# Patient Record
Sex: Male | Born: 1935 | Race: White | Hispanic: No | Marital: Single | Smoking: Never smoker
Health system: Southern US, Community
[De-identification: ages and names within clinical notes are randomized; demographics above are authoritative.]

## PROBLEM LIST (undated history)

## (undated) HISTORY — PX: NASAL SINUS SURGERY: SHX719

## (undated) HISTORY — PX: SKIN LESION EXCISION: SHX2412

---

## 2000-07-12 ENCOUNTER — Encounter: Payer: Self-pay | Admitting: Urology

## 2000-07-12 ENCOUNTER — Inpatient Hospital Stay (HOSPITAL_COMMUNITY): Admission: EM | Admit: 2000-07-12 | Discharge: 2000-07-13 | Payer: Self-pay | Admitting: Emergency Medicine

## 2009-08-09 ENCOUNTER — Inpatient Hospital Stay (HOSPITAL_COMMUNITY): Admission: EM | Admit: 2009-08-09 | Discharge: 2009-08-10 | Payer: Self-pay | Admitting: Emergency Medicine

## 2009-08-09 ENCOUNTER — Ambulatory Visit: Payer: Self-pay | Admitting: Internal Medicine

## 2009-08-19 ENCOUNTER — Ambulatory Visit: Payer: Self-pay | Admitting: Family Medicine

## 2009-08-25 DIAGNOSIS — I10 Essential (primary) hypertension: Secondary | ICD-10-CM | POA: Insufficient documentation

## 2010-07-12 IMAGING — CR DG CHEST 2V
2 series · 2 of 2 positions shown · non-contrast
Comparison: None

CLINICAL DATA: Numbness

CHEST - 2 VIEW

[w chest pa]
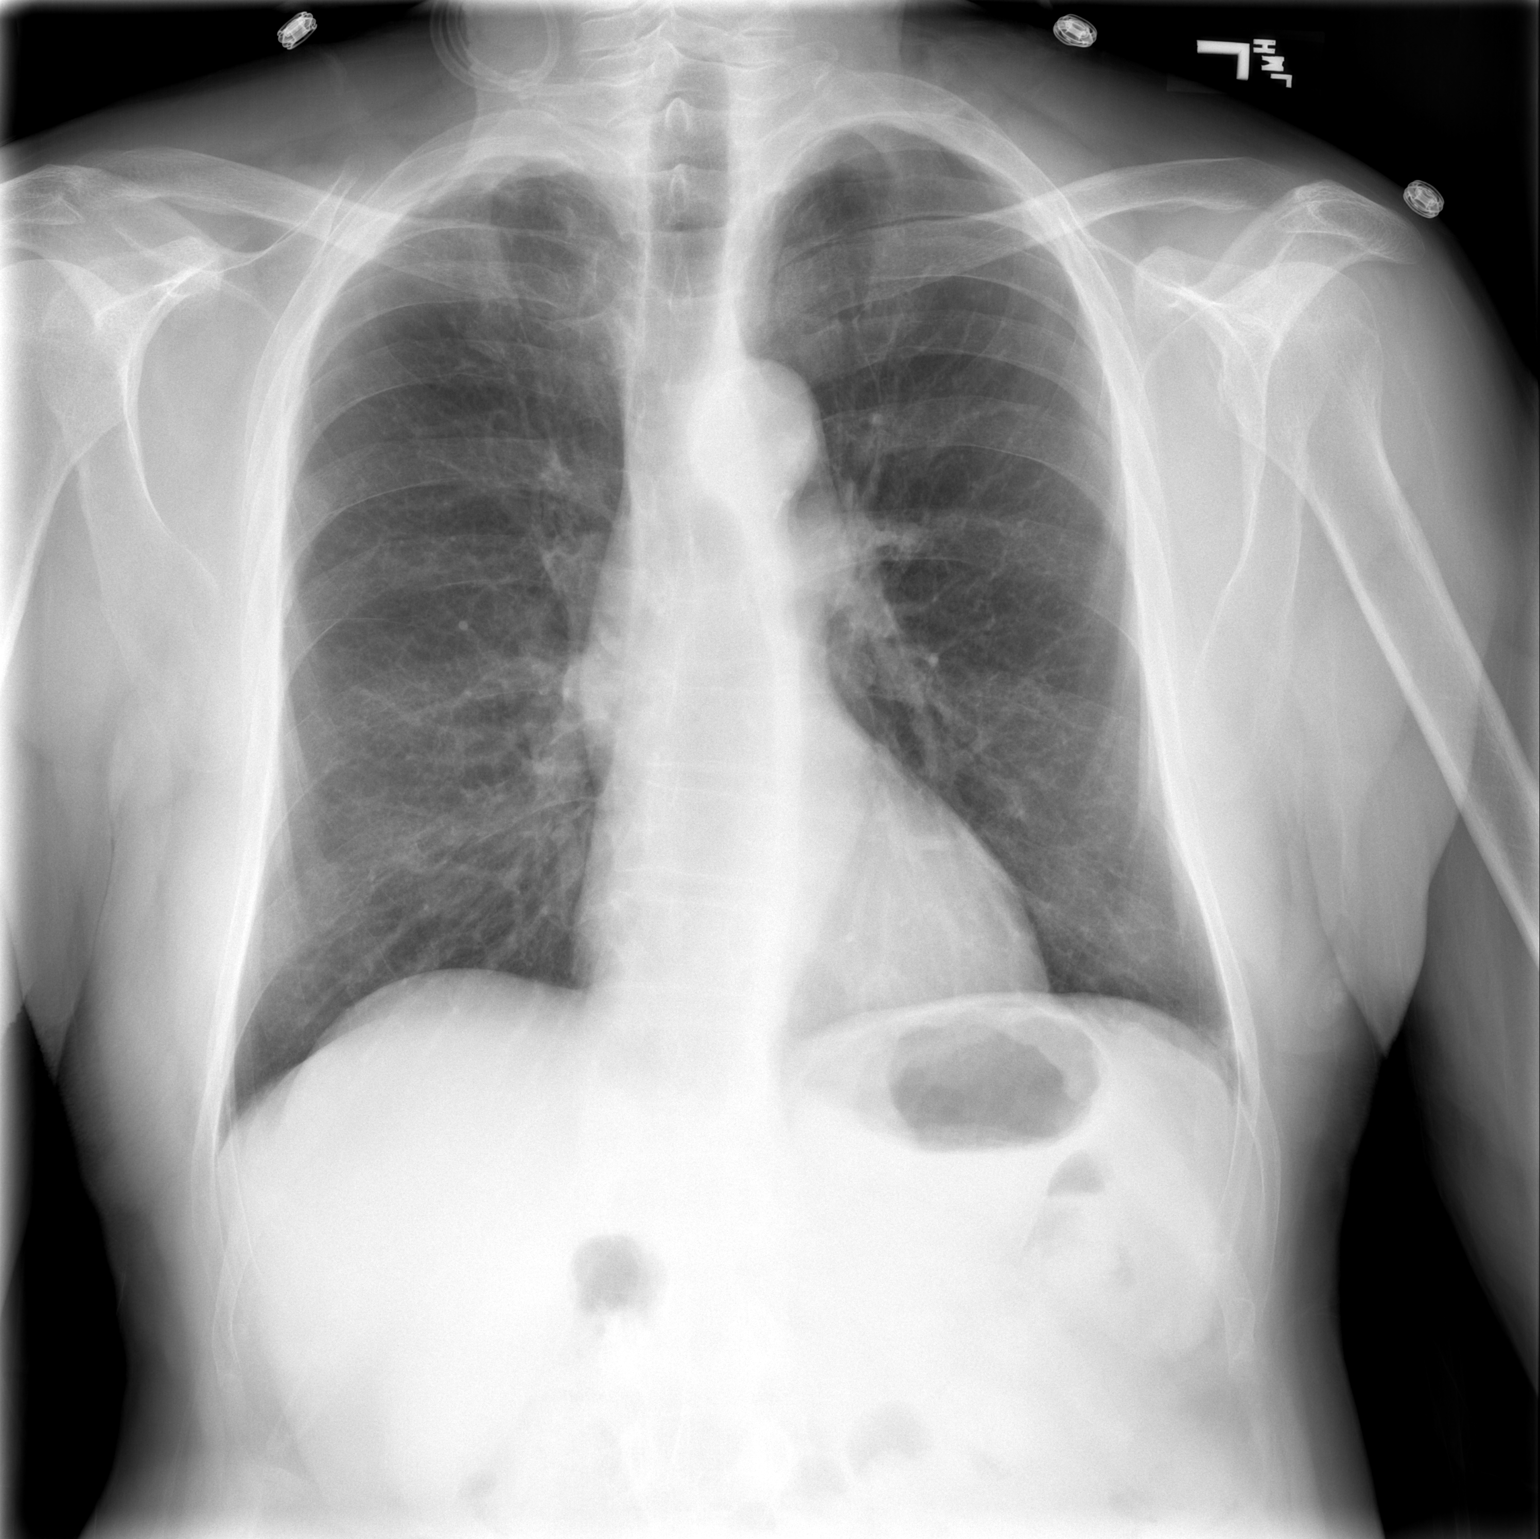

[w chest lat]
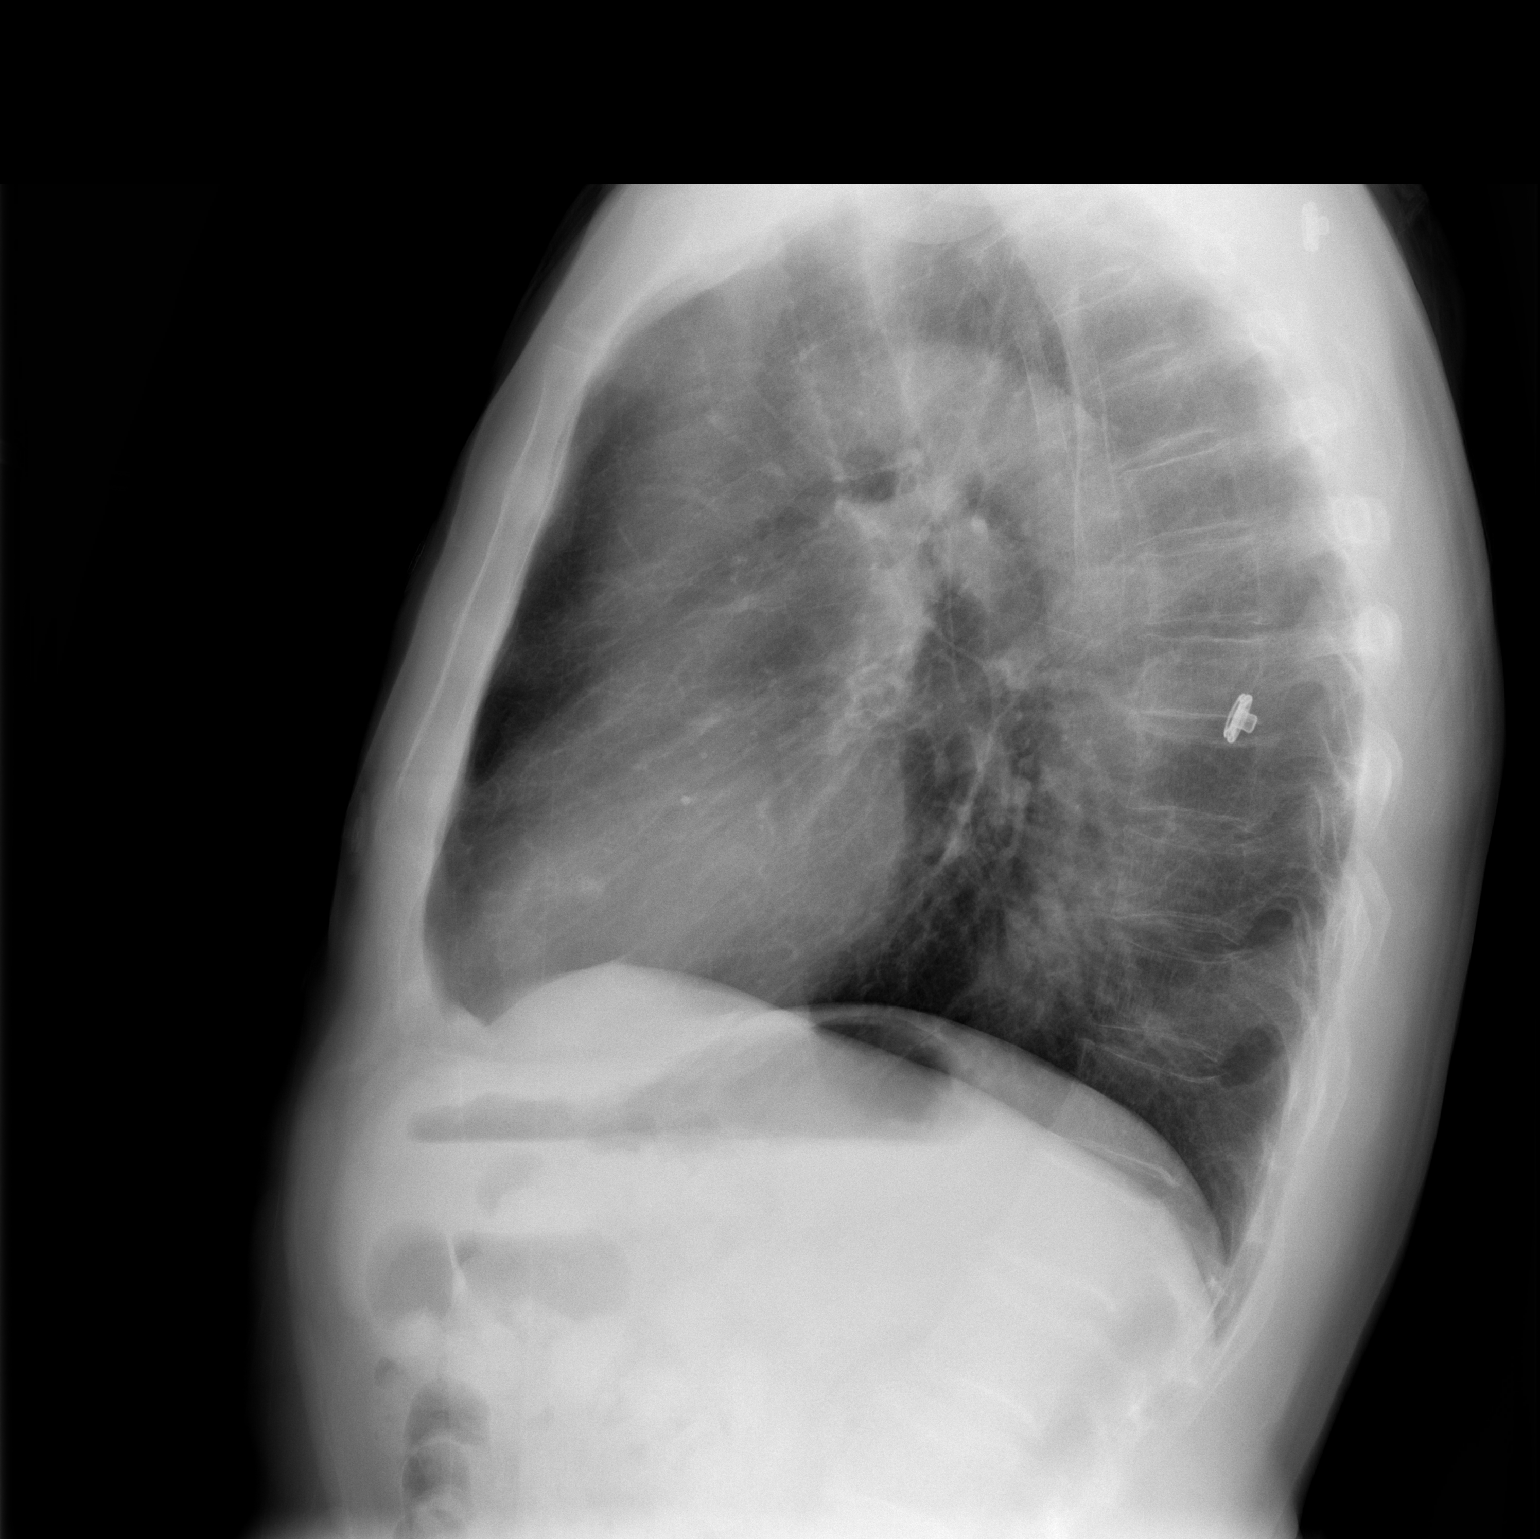

[2 of 2 positions shown; findings below may reference images not displayed]

FINDINGS: Suspect underlying COPD. Midline trachea.  Normal heart
size and mediastinal contours for age.  No pleural effusion or
pneumothorax.  Biapical pleural thickening.  Slightly asymmetric,
greater on the right than left.  No rib destruction to suggest
underlying mass.  Clear lungs.
IMPRESSION: 1. No acute cardiopulmonary disease.
2.  Suspect underlying COPD.

## 2010-07-12 IMAGING — CT CT HEAD W/O CM
1 series · 15 of 30 positions shown, 19 images · non-contrast
Comparison: None.

CLINICAL DATA: 77-year-old male with left arm numbness and
weakness.

CT HEAD WITHOUT CONTRAST
TECHNIQUE: Contiguous axial images were obtained from the base of
the skull through the vertex without contrast.

[Series 2: head routine 4.8 h37s · axial · 0.46mm/px · z∈[-149,+4]mm · 15 of 36 slices shown, 19 images]
[im 2/36  brain]
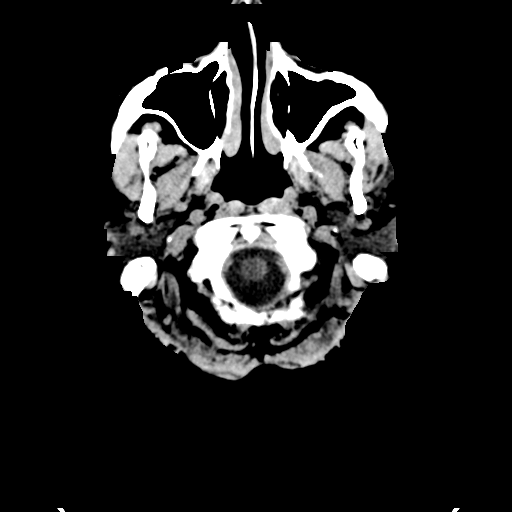
[im 2/36  bone]
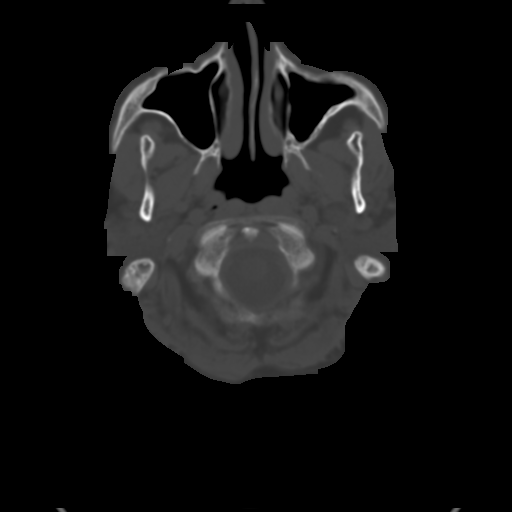
[im 4/36  brain]
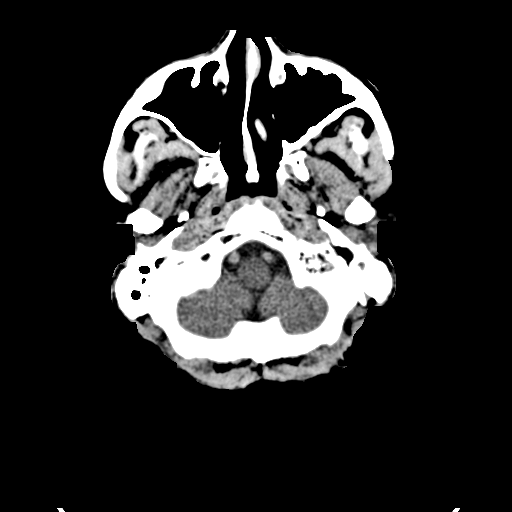
[im 7/36  brain]
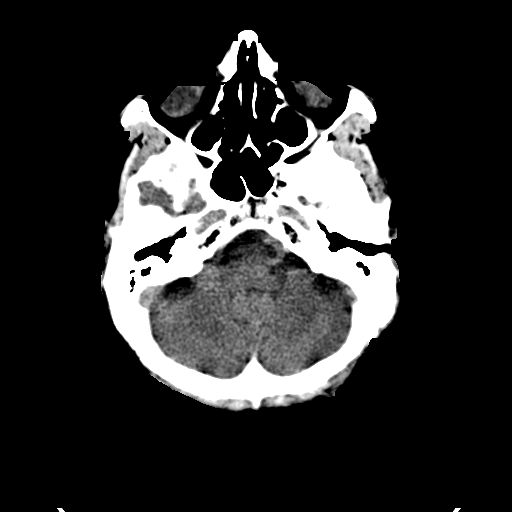
[im 9/36  brain]
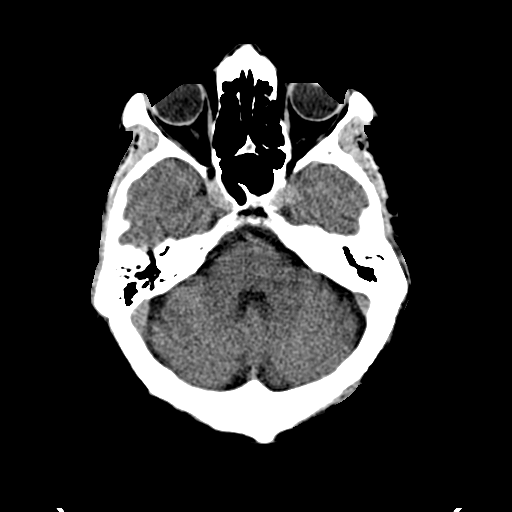
[im 11/36  brain]
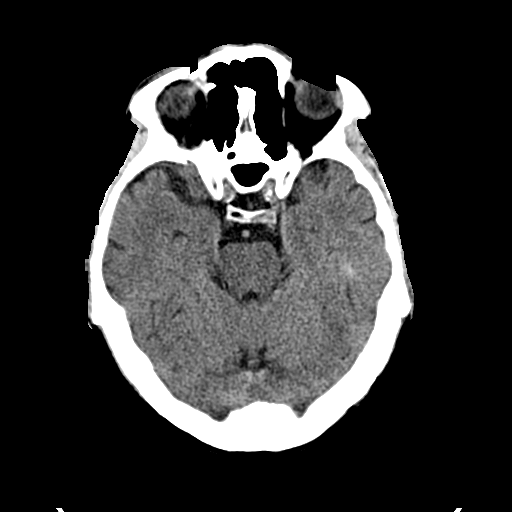
[im 11/36  bone]
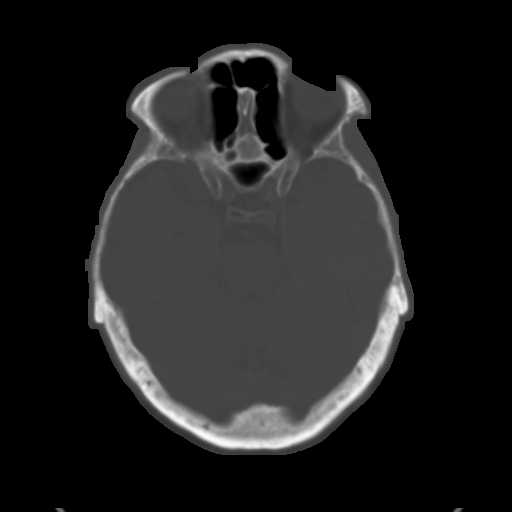
[im 13/36  brain]
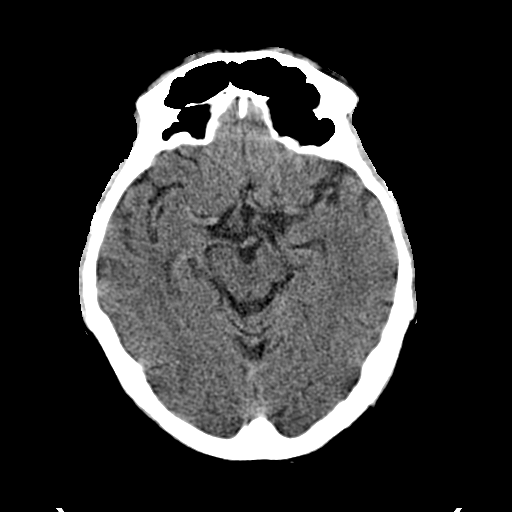
[im 15/36  brain]
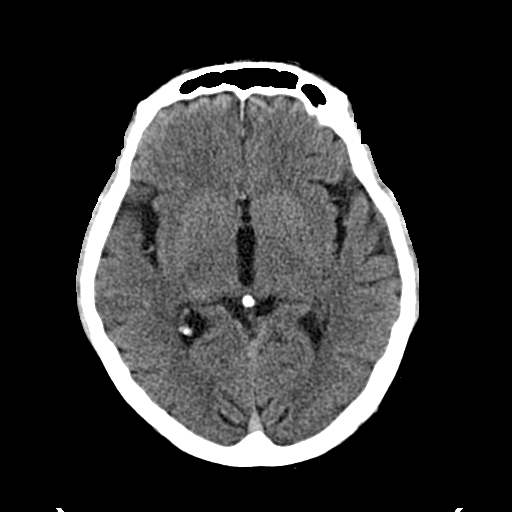
[im 17/36  brain]
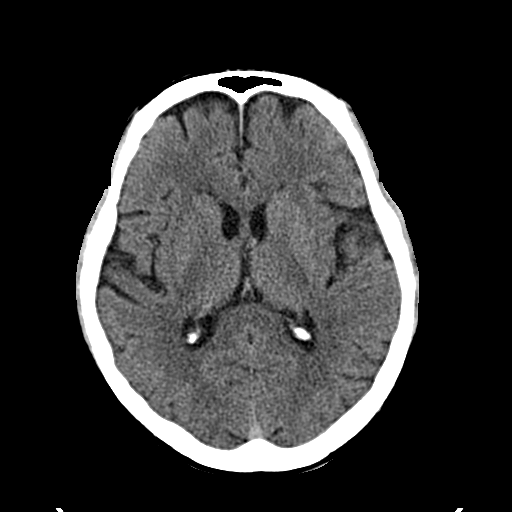
[im 20/36  brain]
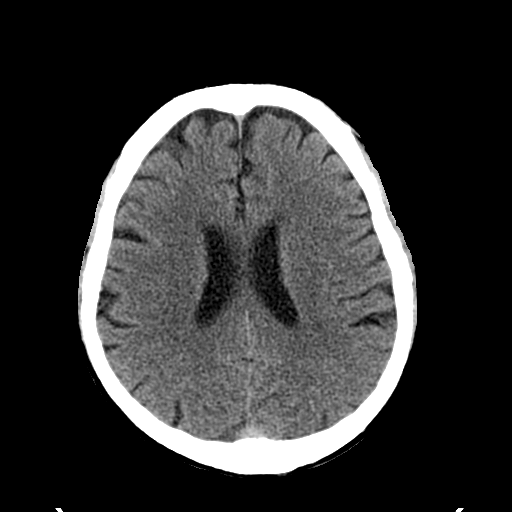
[im 20/36  bone]
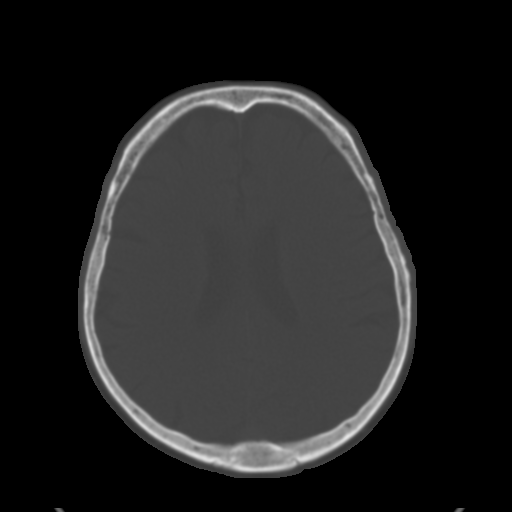
[im 22/36  brain]
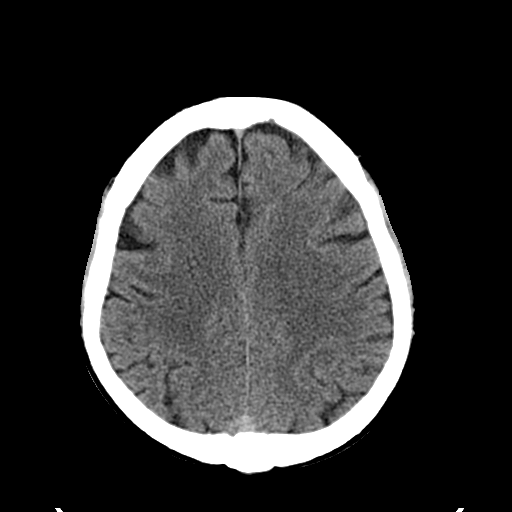
[im 23/36  brain]
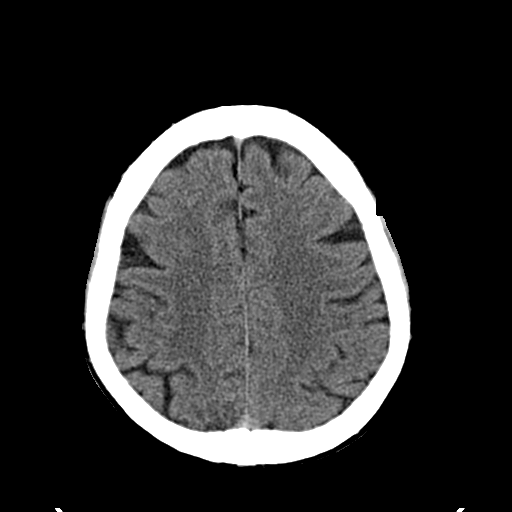
[im 26/36  brain]
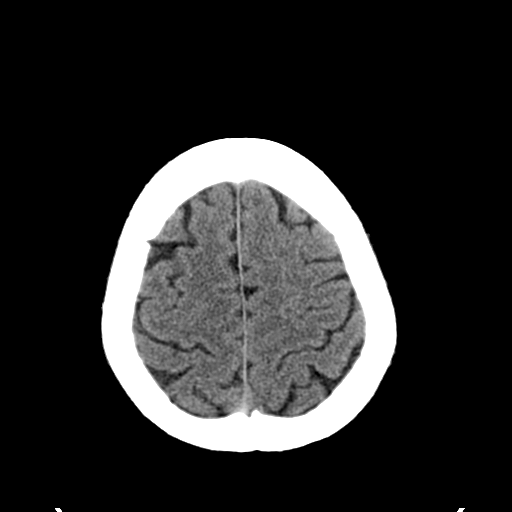
[im 28/36  brain]
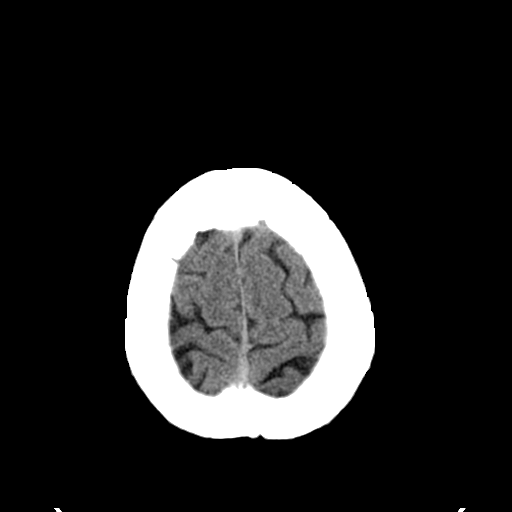
[im 28/36  bone]
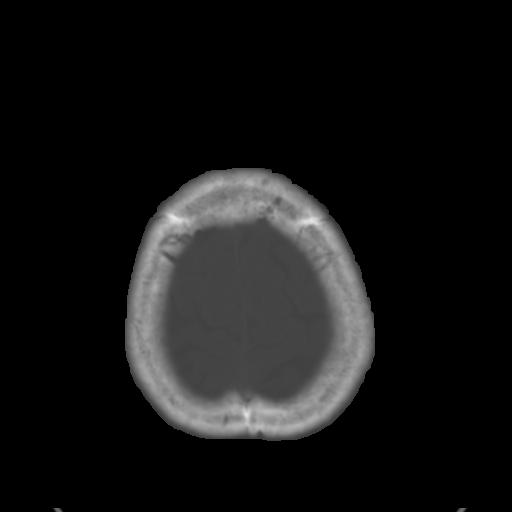
[im 31/36  brain]
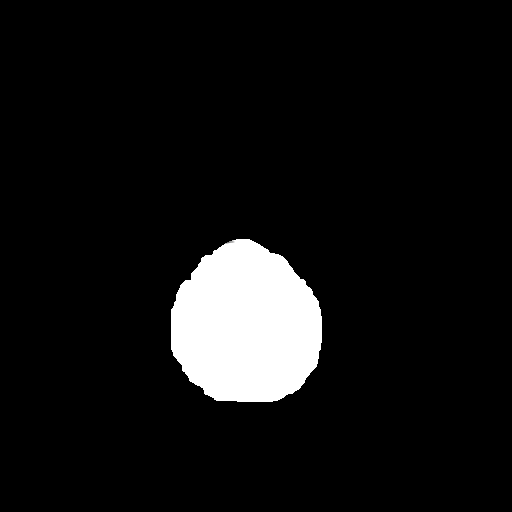
[im 33/36  brain]
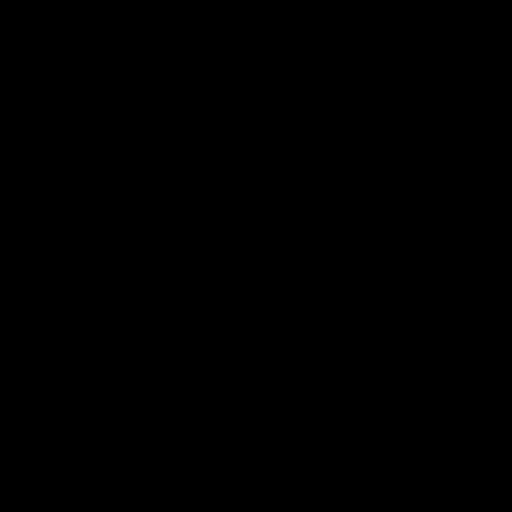

[15 of 30 positions shown; findings below may reference images not displayed]

FINDINGS: Visualized orbits and scalp soft tissues are within
normal limits.  Very mild for age vascular calcifications at the
skull base. Paranasal sinuses are clear.  Left greater than right
mastoid air cell opacification.  Visualized nasopharynx within
normal limits. No acute osseous abnormality identified.

Cerebral volume is within normal limits for age.  Ventricular size
and configuration are within normal limits.  No midline shift, mass
effect, or evidence of mass lesion.  No acute intracranial
hemorrhage identified.  No evidence of cortically based acute
infarction identified.  Gray-white matter differentiation is within
normal limits for age throughout the brain.  No suspicious
intracranial vascular hyperdensity.
IMPRESSION: 1. Normal noncontrast CT appearance of the brain for age.
2.  Left greater than right mastoid air cell opacification, most
commonly this is due to sterile effusion.

## 2010-11-23 LAB — CARDIAC PANEL(CRET KIN+CKTOT+MB+TROPI)
CK, MB: 1.7 ng/mL (ref 0.3–4.0)
Relative Index: INVALID (ref 0.0–2.5)
Relative Index: INVALID (ref 0.0–2.5)
Troponin I: 0.01 ng/mL (ref 0.00–0.06)

## 2010-11-23 LAB — LIPID PANEL
Cholesterol: 132 mg/dL (ref 0–200)
HDL: 30 mg/dL — ABNORMAL LOW (ref >39)
Triglycerides: 68 mg/dL (ref <150)

## 2010-11-23 LAB — COMPREHENSIVE METABOLIC PANEL
ALT: 17 U/L (ref 0–53)
AST: 21 U/L (ref 0–37)
Albumin: 4 g/dL (ref 3.5–5.2)
Alkaline Phosphatase: 61 U/L (ref 39–117)
BUN: 12 mg/dL (ref 6–23)
CO2: 28 mEq/L (ref 19–32)
Calcium: 9.4 mg/dL (ref 8.4–10.5)
Chloride: 104 mEq/L (ref 96–112)
Creatinine, Ser: 1.26 mg/dL (ref 0.4–1.5)
GFR calc Af Amer: >60 mL/min (ref >60)
GFR calc non Af Amer: 56 mL/min — ABNORMAL LOW (ref >60)
Glucose, Bld: 119 mg/dL — ABNORMAL HIGH (ref 70–99)
Potassium: 4.1 mEq/L (ref 3.5–5.1)
Sodium: 139 mEq/L (ref 135–145)
Total Bilirubin: 0.9 mg/dL (ref 0.3–1.2)
Total Protein: 6.8 g/dL (ref 6.0–8.3)

## 2010-11-23 LAB — CBC
HCT: 46.1 % (ref 39.0–52.0)
Hemoglobin: 15.9 g/dL (ref 13.0–17.0)
MCHC: 34.6 g/dL (ref 30.0–36.0)
MCV: 92.1 fL (ref 78.0–100.0)
Platelets: 263 10*3/uL (ref 150–400)
RBC: 5.01 MIL/uL (ref 4.22–5.81)
RDW: 12.8 % (ref 11.5–15.5)
WBC: 6.3 10*3/uL (ref 4.0–10.5)

## 2010-11-23 LAB — POCT CARDIAC MARKERS
CKMB, poc: <1 ng/mL — ABNORMAL LOW (ref 1.0–8.0)
Myoglobin, poc: 92.8 ng/mL (ref 12–200)
Troponin i, poc: <0.05 ng/mL (ref 0.00–0.09)

## 2010-11-23 LAB — APTT: aPTT: 29 seconds (ref 24–37)

## 2010-11-23 LAB — URINALYSIS, ROUTINE W REFLEX MICROSCOPIC
Bilirubin Urine: NEGATIVE
Hgb urine dipstick: NEGATIVE
Ketones, ur: NEGATIVE mg/dL
Nitrite: NEGATIVE
Protein, ur: NEGATIVE mg/dL
Specific Gravity, Urine: 1.017 (ref 1.005–1.030)
Urobilinogen, UA: 0.2 mg/dL (ref 0.0–1.0)

## 2010-11-23 LAB — BASIC METABOLIC PANEL
CO2: 29 mEq/L (ref 19–32)
Calcium: 8.8 mg/dL (ref 8.4–10.5)
Chloride: 103 mEq/L (ref 96–112)
Creatinine, Ser: 1.25 mg/dL (ref 0.4–1.5)
GFR calc Af Amer: >60 mL/min (ref >60)
Glucose, Bld: 110 mg/dL — ABNORMAL HIGH (ref 70–99)
Potassium: 4.2 mEq/L (ref 3.5–5.1)
Sodium: 138 mEq/L (ref 135–145)

## 2010-11-23 LAB — DIFFERENTIAL
Basophils Absolute: 0 10*3/uL (ref 0.0–0.1)
Basophils Relative: 1 % (ref 0–1)
Lymphocytes Relative: 16 % (ref 12–46)
Monocytes Absolute: 0.5 10*3/uL (ref 0.1–1.0)
Neutro Abs: 4.7 10*3/uL (ref 1.7–7.7)
Neutrophils Relative %: 76 % (ref 43–77)

## 2010-11-23 LAB — HEMOGLOBIN A1C
Hgb A1c MFr Bld: 5.7 % (ref 4.6–6.1)
Mean Plasma Glucose: 117 mg/dL

## 2010-11-23 LAB — PROTIME-INR: INR: 1.04 (ref 0.00–1.49)

## 2010-11-23 LAB — TSH: TSH: 2.664 u[IU]/mL (ref 0.350–4.500)

## 2010-11-23 LAB — TROPONIN I: Troponin I: 0.02 ng/mL (ref 0.00–0.06)

## 2011-01-08 NOTE — Op Note (Signed)
[place]  Patient:    Devin Pena, Devin Pena                      MRN: [id number] Proc. Date: 07/12/00 Adm. Date:  [id number] Attending:  Osvaldo Human                           Operative Report  DATE OF BIRTH:  1932-02-06  REFERRING PHYSICIAN:  UROLOGIST:  [place] [facility], M.D.  PREOPERATIVE DIAGNOSIS:  A 3 mm left ureteral calculus.  POSTOPERATIVE DIAGNOSIS:  A 3 mm left ureteral calculus.  INDICATIONS:  A 77 year old male seen earlier this month with a 3 mm calculus in the left mid ureter confirmed by KUB and CT.  Follow up KUB July 07, 2000, shows stone had moved to the left UVJ.  By July 12, 2000, however, patient had intractable left CVA tenderness, unrelieved despite Toradol and morphine.  Decision made to proceed with ureteroscopy and stone manipulation.  PROCEDURE:  Cystoscopy, retrograde, ureteroscopy, stone manipulation.  ANESTHESIA:  General.  DRAINS:  None.  COMPLICATIONS: None.  SPECIMENS:  3-4 mm calcific density in the left distal ureter.  DESCRIPTION OF PROCEDURE:  The patient was prepped and draped in the dorsolithotomy position after institution of adequate level of general anesthesia.  A well lubricated 21 French Panendoscope was gently inserted at the urethral meatus, normal urethra and sphincter.  Nonobstructive prostate. Orifices were quite close to the prostatic urethra.  Right retrograde using a 6 French end-hole catheter showed normal course and caliber of the ureter with no filling defect or obstruction within the pelvis, calyces, or proximal ureter.  There was prompt drainage at 3-5 minutes.  Retrograde on the left side showed a 4 mm filling defect in the left distal ureter with proximal hydronephrosis.  A floppy-tipped glide wire was then negotiated beyond the stone followed with a 6 French end-hole catheter which was left in place for about 5 minutes and then withdrawn.  The short 6 French  ureteroscope was then inserted alongside the glidewire.  The stone was identified within the distal ureter, negotiated into the 3-1/2 french flat wire basket and then gently withdrawn. UReteroscope was then reinserted.  NO additional stony fragments were identified within the distal ureter.  Glidewire was withdrawn.  Careful inspection of the left ureteral orifice showed prompt efflux of pink tinged urine.  The bladder was filled to capacity.  Cystoscope was withdrawn. An 15 French Foley catheter was inserted and left to straight drain and the patient was returned to recovery in satisfactory condition. DD:  07/12/00 TD:  07/12/00 Job: [id number]

## 2011-02-16 LAB — LIPID PANEL
Cholesterol: 153 mg/dL (ref 0–200)
HDL: 42 mg/dL (ref 35–70)
LDL Cholesterol: 94 mg/dL
LDl/HDL Ratio: 3.6
Triglycerides: 83 mg/dL (ref 40–160)

## 2011-02-16 LAB — HEPATIC FUNCTION PANEL
ALK PHOS: 56 U/L (ref 25–125)
ALT: 18 U/L (ref 10–40)
AST: 15 U/L (ref 14–40)
Bilirubin, Total: 0.3 mg/dL

## 2011-02-16 LAB — PSA: PSA: 1.9

## 2012-02-03 DIAGNOSIS — D485 Neoplasm of uncertain behavior of skin: Secondary | ICD-10-CM | POA: Diagnosis not present

## 2012-02-03 DIAGNOSIS — L089 Local infection of the skin and subcutaneous tissue, unspecified: Secondary | ICD-10-CM | POA: Diagnosis not present

## 2012-04-25 DIAGNOSIS — R209 Unspecified disturbances of skin sensation: Secondary | ICD-10-CM | POA: Diagnosis not present

## 2012-04-25 DIAGNOSIS — I10 Essential (primary) hypertension: Secondary | ICD-10-CM | POA: Diagnosis not present

## 2012-04-25 LAB — BASIC METABOLIC PANEL
BUN: 20 mg/dL (ref 4–21)
Creatinine: 1.3 mg/dL (ref 0.6–1.3)
GLUCOSE: 97 mg/dL
POTASSIUM: 5.1 mmol/L (ref 3.4–5.3)
SODIUM: 142 mmol/L (ref 137–147)

## 2012-06-27 DIAGNOSIS — H2510 Age-related nuclear cataract, unspecified eye: Secondary | ICD-10-CM | POA: Diagnosis not present

## 2012-07-10 DIAGNOSIS — Z85828 Personal history of other malignant neoplasm of skin: Secondary | ICD-10-CM | POA: Diagnosis not present

## 2012-07-10 DIAGNOSIS — L570 Actinic keratosis: Secondary | ICD-10-CM | POA: Diagnosis not present

## 2012-07-10 DIAGNOSIS — L821 Other seborrheic keratosis: Secondary | ICD-10-CM | POA: Diagnosis not present

## 2012-07-19 ENCOUNTER — Ambulatory Visit: Payer: Self-pay | Admitting: Ophthalmology

## 2012-07-19 DIAGNOSIS — I10 Essential (primary) hypertension: Secondary | ICD-10-CM | POA: Diagnosis not present

## 2012-07-19 DIAGNOSIS — H2510 Age-related nuclear cataract, unspecified eye: Secondary | ICD-10-CM | POA: Diagnosis not present

## 2012-07-19 DIAGNOSIS — Z01810 Encounter for preprocedural cardiovascular examination: Secondary | ICD-10-CM | POA: Diagnosis not present

## 2012-07-19 DIAGNOSIS — Z01812 Encounter for preprocedural laboratory examination: Secondary | ICD-10-CM | POA: Diagnosis not present

## 2012-07-31 ENCOUNTER — Ambulatory Visit: Payer: Self-pay | Admitting: Ophthalmology

## 2012-07-31 DIAGNOSIS — Z79899 Other long term (current) drug therapy: Secondary | ICD-10-CM | POA: Diagnosis not present

## 2012-07-31 DIAGNOSIS — I10 Essential (primary) hypertension: Secondary | ICD-10-CM | POA: Diagnosis not present

## 2012-07-31 DIAGNOSIS — H269 Unspecified cataract: Secondary | ICD-10-CM | POA: Diagnosis not present

## 2012-07-31 DIAGNOSIS — Z85820 Personal history of malignant melanoma of skin: Secondary | ICD-10-CM | POA: Diagnosis not present

## 2012-07-31 DIAGNOSIS — Z87891 Personal history of nicotine dependence: Secondary | ICD-10-CM | POA: Diagnosis not present

## 2012-07-31 DIAGNOSIS — H2510 Age-related nuclear cataract, unspecified eye: Secondary | ICD-10-CM | POA: Diagnosis not present

## 2012-07-31 DIAGNOSIS — Z8673 Personal history of transient ischemic attack (TIA), and cerebral infarction without residual deficits: Secondary | ICD-10-CM | POA: Diagnosis not present

## 2012-12-25 DIAGNOSIS — Z85828 Personal history of other malignant neoplasm of skin: Secondary | ICD-10-CM | POA: Diagnosis not present

## 2012-12-25 DIAGNOSIS — C44319 Basal cell carcinoma of skin of other parts of face: Secondary | ICD-10-CM | POA: Diagnosis not present

## 2012-12-25 DIAGNOSIS — D485 Neoplasm of uncertain behavior of skin: Secondary | ICD-10-CM | POA: Diagnosis not present

## 2013-04-04 DIAGNOSIS — H2510 Age-related nuclear cataract, unspecified eye: Secondary | ICD-10-CM | POA: Diagnosis not present

## 2013-04-04 DIAGNOSIS — Z961 Presence of intraocular lens: Secondary | ICD-10-CM | POA: Diagnosis not present

## 2013-07-09 DIAGNOSIS — Z85828 Personal history of other malignant neoplasm of skin: Secondary | ICD-10-CM | POA: Diagnosis not present

## 2013-07-09 DIAGNOSIS — C44519 Basal cell carcinoma of skin of other part of trunk: Secondary | ICD-10-CM | POA: Diagnosis not present

## 2013-07-09 DIAGNOSIS — D485 Neoplasm of uncertain behavior of skin: Secondary | ICD-10-CM | POA: Diagnosis not present

## 2013-07-09 DIAGNOSIS — L723 Sebaceous cyst: Secondary | ICD-10-CM | POA: Diagnosis not present

## 2013-07-09 DIAGNOSIS — L570 Actinic keratosis: Secondary | ICD-10-CM | POA: Diagnosis not present

## 2014-01-07 DIAGNOSIS — L723 Sebaceous cyst: Secondary | ICD-10-CM | POA: Diagnosis not present

## 2014-01-07 DIAGNOSIS — L821 Other seborrheic keratosis: Secondary | ICD-10-CM | POA: Diagnosis not present

## 2014-01-07 DIAGNOSIS — Z85828 Personal history of other malignant neoplasm of skin: Secondary | ICD-10-CM | POA: Diagnosis not present

## 2014-01-07 DIAGNOSIS — L570 Actinic keratosis: Secondary | ICD-10-CM | POA: Diagnosis not present

## 2014-02-21 DIAGNOSIS — D485 Neoplasm of uncertain behavior of skin: Secondary | ICD-10-CM | POA: Diagnosis not present

## 2014-02-21 DIAGNOSIS — Z85828 Personal history of other malignant neoplasm of skin: Secondary | ICD-10-CM | POA: Diagnosis not present

## 2014-02-21 DIAGNOSIS — C44721 Squamous cell carcinoma of skin of unspecified lower limb, including hip: Secondary | ICD-10-CM | POA: Diagnosis not present

## 2014-05-27 DIAGNOSIS — C44311 Basal cell carcinoma of skin of nose: Secondary | ICD-10-CM | POA: Diagnosis not present

## 2014-05-27 DIAGNOSIS — Z85828 Personal history of other malignant neoplasm of skin: Secondary | ICD-10-CM | POA: Diagnosis not present

## 2014-05-27 DIAGNOSIS — D485 Neoplasm of uncertain behavior of skin: Secondary | ICD-10-CM | POA: Diagnosis not present

## 2014-05-30 ENCOUNTER — Ambulatory Visit (INDEPENDENT_AMBULATORY_CARE_PROVIDER_SITE_OTHER): Payer: Medicare Other

## 2014-05-30 VITALS — BP 166/64 | HR 86 | Resp 12

## 2014-05-30 DIAGNOSIS — G5761 Lesion of plantar nerve, right lower limb: Secondary | ICD-10-CM | POA: Diagnosis not present

## 2014-05-30 DIAGNOSIS — R52 Pain, unspecified: Secondary | ICD-10-CM

## 2014-05-30 MED ORDER — MELOXICAM 15 MG PO TABS: ORAL_TABLET | ORAL | Status: AC

## 2014-05-30 NOTE — Patient Instructions (Signed)
Morton's Neuroma Neuralgia (nerve pain) or neuroma (benign [non-cancerous] nerve tumor) may develop on any interdigital nerve. The interdigital nerves (nerves between digits) of the foot travel beneath and between the metatarsals (long bones of the fore foot) and pass the nerve endings to the toes. The third interdigital is a common place for a small neuroma to form called Morton's neuroma. Another nerve to be affected commonly is the fourth interdigital nerve. This would be in approximately in the area of the base or ball under the bottom of your fourth toe. This condition occurs more commonly in women and is usually on one side. It is usually first noticed by pain radiating (spreading) to the ball of the foot or to the toes. CAUSES The cause of interdigital neuralgia may be from low grade repetitive trauma (damage caused by an accident) as in activities causing a repeated pounding of the foot (running, jumping etc.). It is also caused by improper footwear or recent loss of the fatty padding on the bottom of the foot. TREATMENT  The condition often resolves (goes away) simply with decreasing activity if that is thought to be the cause. Proper shoes are beneficial. Orthotics (special foot support aids) such as a metatarsal bar are often beneficial. This condition usually responds to conservative therapy, however if surgery is necessary it usually brings complete relief. HOME CARE INSTRUCTIONS   Apply ice to the area of soreness for 15-20 minutes, 03-04 times per day, while awake for the first 2 days. Put ice in a plastic bag and place a towel between the bag of ice and your skin.  Only take over-the-counter or prescription medicines for pain, discomfort, or fever as directed by your caregiver. MAKE SURE YOU:   Understand these instructions.  Will watch your condition.  Will get help right away if you are not doing well or get worse. Document Released: 11/15/2000 Document Revised: 11/01/2011  Document Reviewed: 10/10/2013 [facility] Patient Information 2015 [place], [place]. This information is not intended to replace advice given to you by your health care provider. Make sure you discuss any questions you have with your health care provider.    ICE INSTRUCTIONS  Apply ice or cold pack to the affected area at least 3 times a day for 10-15 minutes each time.  You should also use ice after prolonged activity or vigorous exercise.  Do not apply ice longer than 20 minutes at one time.  Always keep a cloth between your skin and the ice pack to prevent burns.  Being consistent and following these instructions will help control your symptoms.  We suggest you purchase a gel ice pack because they are reusable and do bit leak.  Some of them are designed to wrap around the area.  Use the method that works best for you.  Here are some other suggestions for icing.   Use a frozen bag of peas or corn-inexpensive and molds well to your body, usually stays frozen for 10 to 20 minutes.  Wet a towel with cold water and squeeze out the excess until it's damp.  Place in a bag in the freezer for 20 minutes. Then remove and use.

## 2014-05-30 NOTE — Progress Notes (Signed)
   Subjective:    Patient ID: Devin Pena, male    DOB: 1931-11-17, 80 y.o.   MRN: [id number]  HPI PT STATED RT BALL OF FOOT IS PAINFUL AND HAVE BURNING SENSATION. THE FOOT IS NOT GETTING WORSE. THE FOOT GET AGGRAVATED BY WORSE. TRIED TO USED CREAM FOR PAIN, AND SOAK WITH HOT/COLD IT HELP SOME.   Review of Systems  Musculoskeletal: Positive for back pain.  Hematological: Bruises/bleeds easily.  All other systems reviewed and are negative.      Objective:   Physical Exam 80 year old white male well-developed well-nourished oriented x3 presents at this time with a new problem has pain from last couple weeks ball of his right foot has had history of neuroma years ago that was treated with cup of injections in that same area it feels similar. Patient relates that he had an injury stubbed his foot the ball of his foot coming down some stairs about 3 weeks or 4 weeks ago and is been hurting since although much better today than it had been last several days. He took some ibuprofen the other day which may have helped.  Lower extremity objective findings reveal vascular status to be intact pedal pulses palpable DP and PT +2/4 bilateral capillary refill time 3 seconds mild varicosities noted neurologically epicritic and proprioceptive sensations intact and symmetric bilateral there is normal plantar response and DTRs. There is pain on direct lateral compression second interspace right left foot unremarkable patient does have mild digital contractures slight hallux abductus deformity although otherwise rectus foot type noted ambulating good athletic or walking shoes a very active still working daily and also does dance with his wife on a regular basis x-rays reveal mild osteopenic changes no fractures no osseous abnormalities mild digital contractures semirigid in nature there is some narrowing of the second interspace noted radiographically otherwise unremarkable no signs of fracture no other osseous  abnormalities identified       Assessment & Plan:  Assessment possibly contusion foot with re\re exacerbation of neuroma second interspace right foot plan at this time recommend warm compress ice pack prescription for MOBIC or meloxicam is given as alternative the ibuprofen also maintain Y. coming shoes currently wearing a pair of [employer] athletic shoes. If not resolves are improved within 3-4 weeks would be candidate for his request for steroid injection. Instructions for ice and neuroma information is dispensed the patient this time reevaluate in 3 or 4 weeks if needed however based on improvement over the last couple days she'll likely resolve as this was a contusion and he is managing well.  Harriet Masson DPM

## 2014-06-10 ENCOUNTER — Ambulatory Visit (INDEPENDENT_AMBULATORY_CARE_PROVIDER_SITE_OTHER): Payer: Medicare Other

## 2014-06-10 VITALS — BP 158/94 | HR 68 | Resp 12

## 2014-06-10 DIAGNOSIS — G5761 Lesion of plantar nerve, right lower limb: Secondary | ICD-10-CM | POA: Diagnosis not present

## 2014-06-10 DIAGNOSIS — M779 Enthesopathy, unspecified: Secondary | ICD-10-CM

## 2014-06-10 DIAGNOSIS — M7751 Other enthesopathy of right foot: Secondary | ICD-10-CM | POA: Diagnosis not present

## 2014-06-10 DIAGNOSIS — M2580 Other specified joint disorders, unspecified joint: Secondary | ICD-10-CM

## 2014-06-10 DIAGNOSIS — M778 Other enthesopathies, not elsewhere classified: Secondary | ICD-10-CM

## 2014-06-10 NOTE — Patient Instructions (Signed)

## 2014-06-10 NOTE — Progress Notes (Signed)
   Subjective:    Patient ID: MARKEY DEADY, male    DOB: 09/03/1931, 80 y.o.   MRN: [id number]  HPI  ''RT FOOT IS DOING A BETTER, BUT WHEN STEPPING CERTAIN WAY I GET SHARP PAIN.''  Review of Systems no new findings or systemic changes noted     Objective:   Physical Exam Continues to have some pain and tenderness on direct lateral compression first and second interspaces of right foot sometimes the sesamoid region however most pair of second MTP area and second interspace with proximal radiation of pain consistent with neuroma positive Biagio Borg sign improved with the anti-inflammatory however still reacting yet similar episode 14 years ago that responded to steroid injection. At this time patient is requesting a steroid injection also having some issues with his hand has had problems with carpal tunnel his hand as well partially objective findings unchanged pedal pulses are palpable DP and PT +2/4 bilateral capillary refill time 3 seconds there is tenderness on compression second interspace right and somewhat around the sesamoid as well as any compensatory gait changes at this time will consider more invasive options per patient request.       Assessment & Plan:  Assessment capsulitis second MTP area possibly associated neuroma second metatarsal space on the second common digital nerve as well as mild sesamoiditis. Plan at this time injection 10 mg Kenalog and 20 mg Marcaine plain infiltrated to the second interspace second, second digit, nerve as well some to the first interspace around the MTP joint capsule. Patient tolerated the injection well advised to apply warm compress ice pack per ice pack every evening recheck within a month or 2 if no improvement may be candidate for additional booster injections maintain wide accommodative shoes in the interim  Harriet Masson DPM

## 2014-07-01 DIAGNOSIS — Z85828 Personal history of other malignant neoplasm of skin: Secondary | ICD-10-CM | POA: Diagnosis not present

## 2014-07-01 DIAGNOSIS — C44311 Basal cell carcinoma of skin of nose: Secondary | ICD-10-CM | POA: Diagnosis not present

## 2014-07-09 DIAGNOSIS — L821 Other seborrheic keratosis: Secondary | ICD-10-CM | POA: Diagnosis not present

## 2014-07-09 DIAGNOSIS — D045 Carcinoma in situ of skin of trunk: Secondary | ICD-10-CM | POA: Diagnosis not present

## 2014-07-09 DIAGNOSIS — Z85828 Personal history of other malignant neoplasm of skin: Secondary | ICD-10-CM | POA: Diagnosis not present

## 2014-07-09 DIAGNOSIS — L570 Actinic keratosis: Secondary | ICD-10-CM | POA: Diagnosis not present

## 2014-07-09 DIAGNOSIS — D485 Neoplasm of uncertain behavior of skin: Secondary | ICD-10-CM | POA: Diagnosis not present

## 2014-10-10 DIAGNOSIS — S60221A Contusion of right hand, initial encounter: Secondary | ICD-10-CM | POA: Diagnosis not present

## 2014-10-21 DIAGNOSIS — M79641 Pain in right hand: Secondary | ICD-10-CM | POA: Diagnosis not present

## 2014-10-29 DIAGNOSIS — M19031 Primary osteoarthritis, right wrist: Secondary | ICD-10-CM | POA: Diagnosis not present

## 2014-10-29 DIAGNOSIS — M25331 Other instability, right wrist: Secondary | ICD-10-CM | POA: Diagnosis not present

## 2014-12-10 NOTE — Op Note (Signed)
PATIENT NAME:  Devin Pena, MOFIELD MR#:  [id number] DATE OF BIRTH:  March 13, 1932  DATE OF PROCEDURE:  07/31/2012  PREOPERATIVE DIAGNOSIS:  Cataract, left eye.    POSTOPERATIVE DIAGNOSIS:  Cataract, left eye.  PROCEDURE PERFORMED:  Extracapsular cataract extraction using phacoemulsification with placement of an Alcon [id number], 20.0-diopter posterior chamber lens, serial #[id number].  SURGEON:  Loura Back. Tamie Minteer, MD  ASSISTANT:  None.  ANESTHESIA:  4% lidocaine and 0.75% Marcaine in a 50/50 mixture with 10 units/mL of Hylenex added, given as a peribulbar.   ANESTHESIOLOGIST:  Julianne Handler, MD  COMPLICATIONS:  None.  ESTIMATED BLOOD LOSS:  Less than 1 ml.  DESCRIPTION OF PROCEDURE:  The patient was brought to the operating room and given a peribulbar block.  The patient was then prepped and draped in the usual fashion.  The vertical rectus muscles were imbricated using 5-0 silk sutures.  These sutures were then clamped to the sterile drapes as bridle sutures.  A limbal peritomy was performed extending two clock hours and hemostasis was obtained with cautery.  A partial thickness scleral groove was made at the surgical limbus and dissected anteriorly in a lamellar dissection using an Alcon crescent knife.  The anterior chamber was entered supero-temporally with a Superblade and through the lamellar dissection with a 2.6 mm keratome.  DisCoVisc was used to replace the aqueous and a continuous tear capsulorrhexis was carried out.  Hydrodissection and hydrodelineation were carried out with balanced salt and a 27 gauge canula.  The nucleus was rotated to confirm the effectiveness of the hydrodissection.  Phacoemulsification was carried out using a divide-and-conquer technique.  Total ultrasound time was 2 minutes and 26.8 seconds with an average power of 23.3 percent and CDE of 55.3.  Irrigation/aspiration was used to remove the residual cortex.  DisCoVisc was used to inflate the capsule  and the internal incision was enlarged to 3 mm with the crescent knife.  The intraocular lens was folded and inserted into the capsular bag using the AcrySert delivery system. Irrigation/aspiration was used to remove the residual DisCoVisc.  Miostat was injected into the anterior chamber through the paracentesis track to inflate the anterior chamber and induce miosis.  The wound was checked for leaks and none were found. The conjunctiva was closed with cautery and the bridle sutures were removed.  Two drops of 0.3% Vigamox were placed on the eye.   An eye shield was placed on the eye.  The patient was discharged to the recovery room in good condition. ____________________________ Loura Back Kabrina Christiano, MD sad:slb D: 07/31/2012 12:52:54 ET T: 07/31/2012 13:11:42 ET JOB#: [id number]  cc: Remo Lipps A. Jolene Guyett, MD, <Dictator> Martie Lee MD ELECTRONICALLY SIGNED 08/07/2012 13:19

## 2015-01-08 DIAGNOSIS — D485 Neoplasm of uncertain behavior of skin: Secondary | ICD-10-CM | POA: Diagnosis not present

## 2015-01-08 DIAGNOSIS — L821 Other seborrheic keratosis: Secondary | ICD-10-CM | POA: Diagnosis not present

## 2015-01-08 DIAGNOSIS — Z85828 Personal history of other malignant neoplasm of skin: Secondary | ICD-10-CM | POA: Diagnosis not present

## 2015-01-08 DIAGNOSIS — L570 Actinic keratosis: Secondary | ICD-10-CM | POA: Diagnosis not present

## 2015-07-08 DIAGNOSIS — L821 Other seborrheic keratosis: Secondary | ICD-10-CM | POA: Diagnosis not present

## 2015-07-08 DIAGNOSIS — L570 Actinic keratosis: Secondary | ICD-10-CM | POA: Diagnosis not present

## 2015-07-08 DIAGNOSIS — C4441 Basal cell carcinoma of skin of scalp and neck: Secondary | ICD-10-CM | POA: Diagnosis not present

## 2015-07-08 DIAGNOSIS — C44519 Basal cell carcinoma of skin of other part of trunk: Secondary | ICD-10-CM | POA: Diagnosis not present

## 2015-07-08 DIAGNOSIS — L720 Epidermal cyst: Secondary | ICD-10-CM | POA: Diagnosis not present

## 2015-07-08 DIAGNOSIS — D485 Neoplasm of uncertain behavior of skin: Secondary | ICD-10-CM | POA: Diagnosis not present

## 2015-07-08 DIAGNOSIS — C44729 Squamous cell carcinoma of skin of left lower limb, including hip: Secondary | ICD-10-CM | POA: Diagnosis not present

## 2015-07-08 DIAGNOSIS — Z85828 Personal history of other malignant neoplasm of skin: Secondary | ICD-10-CM | POA: Diagnosis not present

## 2015-07-08 DIAGNOSIS — L565 Disseminated superficial actinic porokeratosis (DSAP): Secondary | ICD-10-CM | POA: Diagnosis not present

## 2015-12-03 DIAGNOSIS — L720 Epidermal cyst: Secondary | ICD-10-CM | POA: Diagnosis not present

## 2015-12-03 DIAGNOSIS — D485 Neoplasm of uncertain behavior of skin: Secondary | ICD-10-CM | POA: Diagnosis not present

## 2015-12-03 DIAGNOSIS — Z85828 Personal history of other malignant neoplasm of skin: Secondary | ICD-10-CM | POA: Diagnosis not present

## 2016-01-06 DIAGNOSIS — L570 Actinic keratosis: Secondary | ICD-10-CM | POA: Diagnosis not present

## 2016-01-06 DIAGNOSIS — L720 Epidermal cyst: Secondary | ICD-10-CM | POA: Diagnosis not present

## 2016-01-06 DIAGNOSIS — Z85828 Personal history of other malignant neoplasm of skin: Secondary | ICD-10-CM | POA: Diagnosis not present

## 2016-01-06 DIAGNOSIS — L821 Other seborrheic keratosis: Secondary | ICD-10-CM | POA: Diagnosis not present

## 2016-02-03 DIAGNOSIS — R209 Unspecified disturbances of skin sensation: Secondary | ICD-10-CM | POA: Insufficient documentation

## 2016-02-03 DIAGNOSIS — D034 Melanoma in situ of scalp and neck: Secondary | ICD-10-CM | POA: Insufficient documentation

## 2016-02-03 DIAGNOSIS — N183 Chronic kidney disease, stage 3 unspecified: Secondary | ICD-10-CM | POA: Insufficient documentation

## 2016-02-03 DIAGNOSIS — C44310 Basal cell carcinoma of skin of unspecified parts of face: Secondary | ICD-10-CM | POA: Insufficient documentation

## 2016-03-05 ENCOUNTER — Encounter: Payer: Self-pay | Admitting: Family Medicine

## 2016-03-05 ENCOUNTER — Ambulatory Visit (INDEPENDENT_AMBULATORY_CARE_PROVIDER_SITE_OTHER): Payer: Medicare Other | Admitting: Family Medicine

## 2016-03-05 VITALS — BP 150/90 | HR 93 | Temp 98.3°F | Resp 16 | Wt 143.0 lb

## 2016-03-05 DIAGNOSIS — T148 Other injury of unspecified body region: Secondary | ICD-10-CM

## 2016-03-05 DIAGNOSIS — W540XXA Bitten by dog, initial encounter: Secondary | ICD-10-CM

## 2016-03-05 DIAGNOSIS — Z23 Encounter for immunization: Secondary | ICD-10-CM | POA: Diagnosis not present

## 2016-03-05 MED ORDER — AMOXICILLIN-POT CLAVULANATE 875-125 MG PO TABS: 1.0000 | ORAL_TABLET | Freq: Two times a day (BID) | ORAL | Status: DC

## 2016-03-05 NOTE — Progress Notes (Signed)
Subjective:     Patient ID: Devin Pena, male   DOB: 07-18-1932, 82 y.o.   MRN: [id number]  HPI  Chief Complaint  Patient presents with  . Follow-up    Patient was seen this [facility] at [facility] in [place] was bit by a dog this morning and was advised by Suzan Garibaldi that he needed a Tetanus. He got bitten on his hands and legs.  States the dog was up to date on rabies vaccine and was owned by his neighbor. Reports he received treatment and suturing for 5 bites on his legs and arms. States he did not find an abx prescription at the pharmacy so wishes [place] to send one in. No knowledge of any Tetanus booster. Accompanied by his wife today.   Review of Systems     Objective:   Physical Exam  Constitutional: He appears well-developed and well-nourished. He appears distressed (mild anxiety).  Skin:  Wound are dressed so did not examine       Assessment:    1. Dog bite - amoxicillin-clavulanate (AUGMENTIN) 875-125 MG tablet; Take 1 tablet by mouth 2 (two) times daily.  Dispense: 14 tablet; Refill: 0  2. Need for diphtheria-tetanus-pertussis (Tdap) vaccine - Tdap vaccine greater than or equal to 7yo IM     Plan:    F/u with Urgent Care as directed. Start antibiotic.

## 2016-03-05 NOTE — Patient Instructions (Signed)
Do f/u with the Urgent Care Clinic as directed for stitch removal.

## 2016-07-07 DIAGNOSIS — L821 Other seborrheic keratosis: Secondary | ICD-10-CM | POA: Diagnosis not present

## 2016-07-07 DIAGNOSIS — L570 Actinic keratosis: Secondary | ICD-10-CM | POA: Diagnosis not present

## 2016-07-07 DIAGNOSIS — Z85828 Personal history of other malignant neoplasm of skin: Secondary | ICD-10-CM | POA: Diagnosis not present

## 2017-01-04 DIAGNOSIS — D485 Neoplasm of uncertain behavior of skin: Secondary | ICD-10-CM | POA: Diagnosis not present

## 2017-01-04 DIAGNOSIS — L820 Inflamed seborrheic keratosis: Secondary | ICD-10-CM | POA: Diagnosis not present

## 2017-01-04 DIAGNOSIS — L570 Actinic keratosis: Secondary | ICD-10-CM | POA: Diagnosis not present

## 2017-01-04 DIAGNOSIS — L821 Other seborrheic keratosis: Secondary | ICD-10-CM | POA: Diagnosis not present

## 2017-01-04 DIAGNOSIS — D1801 Hemangioma of skin and subcutaneous tissue: Secondary | ICD-10-CM | POA: Diagnosis not present

## 2017-01-04 DIAGNOSIS — Z85828 Personal history of other malignant neoplasm of skin: Secondary | ICD-10-CM | POA: Diagnosis not present

## 2017-01-04 DIAGNOSIS — C44719 Basal cell carcinoma of skin of left lower limb, including hip: Secondary | ICD-10-CM | POA: Diagnosis not present

## 2017-06-20 ENCOUNTER — Ambulatory Visit: Payer: Self-pay | Admitting: Physician Assistant

## 2017-07-08 DIAGNOSIS — R350 Frequency of micturition: Secondary | ICD-10-CM | POA: Diagnosis not present

## 2017-07-08 DIAGNOSIS — B029 Zoster without complications: Secondary | ICD-10-CM | POA: Diagnosis not present

## 2022-10-05 ENCOUNTER — Emergency Department
Admission: EM | Admit: 2022-10-05 | Discharge: 2022-10-06 | Disposition: A | Discharge status: Home / Self Care | Payer: No Typology Code available for payment source | Attending: Emergency Medicine | Admitting: Emergency Medicine

## 2022-10-05 ENCOUNTER — Other Ambulatory Visit: Payer: Self-pay

## 2022-10-05 DIAGNOSIS — W182XXA Fall in (into) shower or empty bathtub, initial encounter: Secondary | ICD-10-CM | POA: Diagnosis not present

## 2022-10-05 DIAGNOSIS — S8991XA Unspecified injury of right lower leg, initial encounter: Secondary | ICD-10-CM | POA: Diagnosis present

## 2022-10-05 DIAGNOSIS — S81811A Laceration without foreign body, right lower leg, initial encounter: Secondary | ICD-10-CM | POA: Insufficient documentation

## 2022-10-05 MED ORDER — LIDOCAINE-EPINEPHRINE-TETRACAINE (LET) TOPICAL GEL
3.0000 mL | Freq: Once | TOPICAL | Status: AC
  Administered 2022-10-05: 3 mL via TOPICAL
  Filled 2022-10-05: qty 3

## 2022-10-05 NOTE — ED Triage Notes (Signed)
Pt presents to ER via ems from home after pt had a slip in the shower.  Pt states he was getting out of shower, when his foot slipped and pt caught his right leg on the side of the bath tub.  Pt states he got an appx 4-5 in lac/skin tear longitudinally across his right calf.  Pt denies taking blood thinners.  Pt denies much pain to leg.  Is A&O x4 and in NAD and ambulatory to triage.

## 2022-10-05 NOTE — Discharge Instructions (Addendum)
Keep the wound clean, dry, and covered.  See your primary provider in 7 to 10 days for wound check and Derma-clip removal.  May also remove the derma clips in a week and a half as needed.  You may apply Steri-Strips after that if necessary.

## 2022-10-05 NOTE — ED Notes (Signed)
Pts wound on right lower leg is cleaned, dried and wrapped. Pt given extra dressing supplies for home. He was educated on how to clean and cover wound and signs of infection to look out for. He was informed to follow up with his doctor. Pt walked to lobby and called for his ride. He refused vitals prior to discharge.

## 2022-10-05 NOTE — ED Provider Notes (Signed)
[facility] Emergency Department Provider Note     Event Date/Time   First MD Initiated Contact with Patient 10/05/22 1932     (approximate)   History   Extremity Laceration   HPI  Devin Pena is a 89 y.o. male presents to the ED for evaluation of accidental laceration/skin tear to his right lower leg.  Patient into his bathtub to turn on the water, foot slipped, causing his leg to slide along the side of the tub.  He presents with a large skin tear/laceration to the medial right calf but no other injury reported at this time.     Physical Exam   Triage Vital Signs: ED Triage Vitals  Enc Vitals Group     BP 10/05/22 1920 (!) 193/93     Pulse Rate 10/05/22 1920 (!) 105     Resp 10/05/22 1920 18     Temp 10/05/22 1920 97.8 F (36.6 C)     Temp Source 10/05/22 1920 Oral     SpO2 10/05/22 1920 98 %     Weight 10/05/22 1922 135 lb (61.2 kg)     Height 10/05/22 1922 5' 8"$  (1.727 m)     Head Circumference --      Peak Flow --      Pain Score 10/05/22 1921 2     Pain Loc --      Pain Edu? --      Excl. in [place]? --     Most recent vital signs: Vitals:   10/05/22 1920  BP: (!) 193/93  Pulse: (!) 105  Resp: 18  Temp: 97.8 F (36.6 C)  SpO2: 98%    General Awake, no distress. NAD CV:  Good peripheral perfusion.  RESP:  Normal effort.  ABD:  No distention.  MSK:  Spinal alignment without midline tenderness or spasm.  Normal active range of motion of the upper and lower extremities. SKIN:  RLE with a large skin tear to the medial calf.  No active bleeding at this time.  Subcu tissue exposed on the proximal end and shallow skin tear noted to the distal end.   ED Results / Procedures / Treatments   Labs (all labs ordered are listed, but only abnormal results are displayed) Labs Reviewed - No data to display   EKG    RADIOLOGY    PROCEDURES:  Critical Care performed: No  ..Laceration Repair  Date/Time: 10/05/2022 8:34  PM  Performed by: Melvenia Needles, PA-C Authorized by: Melvenia Needles, PA-C   Consent:    Consent obtained:  Verbal   Consent given by:  Patient   Risks, benefits, and alternatives were discussed: yes     Risks discussed:  Pain and poor wound healing Universal protocol:    Site/side marked: yes     Patient identity confirmed:  Verbally with patient Anesthesia:    Anesthesia method:  Topical application   Topical anesthetic:  LET Laceration details:    Location:  Leg   Leg location:  R lower leg   Length (cm):  10   Depth (mm):  5 Pre-procedure details:    Preparation:  Patient was prepped and draped in usual sterile fashion Exploration:    Limited defect created (wound extended): no     Hemostasis achieved with:  LET   Contaminated: no   Treatment:    Area cleansed with:  Saline   Amount of cleaning:  Standard   Irrigation solution:  Sterile  saline   Irrigation volume:  30   Irrigation method:  Syringe   Debridement:  None   Undermining:  None   Scar revision: no   Skin repair:    Repair method:  Steri-Strips   Number of Steri-Strips:  5 Approximation:    Approximation:  Close Repair type:    Repair type:  Simple Post-procedure details:    Dressing:  Non-adherent dressing   Procedure completion:  Tolerated well, no immediate complications    MEDICATIONS ORDERED IN ED: Medications  lidocaine-EPINEPHrine-tetracaine (LET) topical gel (3 mLs Topical Given 10/05/22 2122)     IMPRESSION / MDM / ASSESSMENT AND PLAN / ED COURSE  I reviewed the triage vital signs and the nursing notes.                              Differential diagnosis includes, but is not limited to, skin tear, laceration, hematoma, abrasion, contusion  Patient's presentation is most consistent with acute, uncomplicated illness.  Patient's diagnosis is consistent with large skin tear/laceration.  Extensive wound care repair which was performed with good wound edge approximation,  using Derma-clips.  Patient will be discharged home with wound care instructions and supplies. Patient is to follow up with primary provider as needed or otherwise directed. Patient is given ED precautions to return to the ED for any worsening or new symptoms.     FINAL CLINICAL IMPRESSION(S) / ED DIAGNOSES   Final diagnoses:  Laceration of right lower extremity, initial encounter     Rx / DC Orders   ED Discharge Orders     None        Note:  This document was prepared using Dragon voice recognition software and may include unintentional dictation errors.    Melvenia Needles, PA-C 10/05/22 2206    Rada Hay, MD 10/05/22 [phone]

## 2022-10-08 ENCOUNTER — Ambulatory Visit: Payer: Self-pay

## 2022-10-08 NOTE — Telephone Encounter (Signed)
  Chief Complaint: Bleeding Symptoms: bleeding Frequency: now Pertinent Negatives: Patient denies  Disposition: []$ ED /[x]$ Urgent Care (no appt availability in office) / []$ Appointment(In office/virtual)/ []$  [facility]/ [x]$ Home Care/ []$ Refused Recommended Disposition /[]$ [facility]/ []$  Follow-up with PCP Additional Notes: Pt fell in the tub and cut open his leg. Pt was seen at ED. Pt removed bandage this evening, and wound starting bleeding again. Pt got the bleeding to stop, however is not confident in re-wrapping the leg. PT will go to [place] or UC or Ed for help with re-wrapping the leg.  Reason for Disposition  Minor cut or scratch  Answer Assessment - Initial Assessment Questions 1. APPEARANCE of INJURY: "What does the injury look like?"      Bleeding 2. SIZE: "How large is the cut?"       3. BLEEDING: "Is it bleeding now?" If Yes, ask: "Is it difficult to stop?"      yes 4. LOCATION: "Where is the injury located?"      Right leg 5. ONSET: "How long ago did the injury occur?"      The other night 6. MECHANISM: "Tell me how it happened."      [place] getting into the tub 7. TETANUS: "When was the last tetanus booster?"      8. PREGNANCY: "Is there any chance you are pregnant?" "When was your last menstrual period?"  Protocols used: Cuts and Lacerations-A-AH

## 2022-10-13 ENCOUNTER — Emergency Department
Admission: EM | Admit: 2022-10-13 | Discharge: 2022-10-13 | Disposition: A | Discharge status: Home / Self Care | Payer: No Typology Code available for payment source | Attending: Emergency Medicine | Admitting: Emergency Medicine

## 2022-10-13 ENCOUNTER — Other Ambulatory Visit: Payer: Self-pay

## 2022-10-13 ENCOUNTER — Encounter: Payer: Self-pay | Admitting: Emergency Medicine

## 2022-10-13 ENCOUNTER — Ambulatory Visit: Payer: Medicare Other | Admitting: Physician Assistant

## 2022-10-13 DIAGNOSIS — X58XXXD Exposure to other specified factors, subsequent encounter: Secondary | ICD-10-CM | POA: Diagnosis not present

## 2022-10-13 DIAGNOSIS — I129 Hypertensive chronic kidney disease with stage 1 through stage 4 chronic kidney disease, or unspecified chronic kidney disease: Secondary | ICD-10-CM | POA: Insufficient documentation

## 2022-10-13 DIAGNOSIS — S81812D Laceration without foreign body, left lower leg, subsequent encounter: Secondary | ICD-10-CM | POA: Insufficient documentation

## 2022-10-13 DIAGNOSIS — Z85828 Personal history of other malignant neoplasm of skin: Secondary | ICD-10-CM | POA: Insufficient documentation

## 2022-10-13 DIAGNOSIS — N189 Chronic kidney disease, unspecified: Secondary | ICD-10-CM | POA: Insufficient documentation

## 2022-10-13 DIAGNOSIS — Z4889 Encounter for other specified surgical aftercare: Secondary | ICD-10-CM | POA: Insufficient documentation

## 2022-10-13 DIAGNOSIS — Z5189 Encounter for other specified aftercare: Secondary | ICD-10-CM

## 2022-10-13 MED ORDER — CEPHALEXIN 500 MG PO CAPS
500.0000 mg | ORAL_CAPSULE | Freq: Once | ORAL | Status: AC
  Administered 2022-10-13: 500 mg via ORAL
  Filled 2022-10-13: qty 1

## 2022-10-13 MED ORDER — CEPHALEXIN 500 MG PO CAPS: 500.0000 mg | ORAL_CAPSULE | Freq: Four times a day (QID) | ORAL | 0 refills | Status: AC

## 2022-10-13 NOTE — Discharge Instructions (Addendum)
See your doctor in the next 3 to 5 days for wound check and removal of the Steri-Strips if they have not been removed yet.  Take antibiotics as prescribed.  Thank you for choosing [place] for your health care today!  Please see your primary doctor this week for a follow up appointment.   Sometimes, in the early stages of certain disease courses it is difficult to detect in the emergency department evaluation -- so, it is important that you continue to monitor your symptoms and call your doctor right away or return to the emergency department if you develop any new or worsening symptoms.  Please go to the following website to schedule new (and existing) patient appointments:   [email or web address]  If you do not have a primary doctor try calling the following clinics to establish care:  If you have insurance:  [facility] [phone] [place] [address]   [facility]  [phone] [place] [place] [address]   If you do not have insurance:  [facility]  [phone] [address] [place] [address]   The following is another list of primary care offices in the area who are accepting new patients at this time.  Please reach out to one of them directly and let them know you would like to schedule an appointment to follow up on an Emergency Department visit, and/or to establish a new primary care provider (PCP).  There are likely other primary care clinics in the are who are accepting new patients, but this is an excellent place to start:  [place] physician: Dr Lavon Paganini [address] [place], [place] [address] [phone]  [facility] Lead Physician: Dr Steele Sizer [address], [place], [place] [address] [phone]  [place] Physician: Dr Park Liter [address] [place], [place]  [address] [phone]  [facility] Lead Physician: Dr Dewaine Oats [place], [place],  [address] [phone]  [facility] Physician: Dr Halina Maidens [address] [place],  [address] [phone]   It was my pleasure to care for you today.   Hoover Brunette Jacelyn Grip, MD

## 2022-10-13 NOTE — ED Triage Notes (Signed)
Pt arrived via POV-pt drove himself here, states his R lower leg wound started bleeding and could not get it to stop, pt has steri strips in place at this time with stained blood and dried blood streakign down the leg.  Pt states he thinks the wound smells as well. No odor noted at this time, no pus draining and site does not feel warm at this time.

## 2022-10-13 NOTE — ED Provider Notes (Signed)
[facility] Provider Note    Event Date/Time   First MD Initiated Contact with Patient 10/13/22 [phone]     (approximate)   History   Wound Check   HPI  Devin Pena is a 89 y.o. male   Past medical history of hypertension, skin cancer, CKD who presents to the emergency department for wound check.  He sustained an injury to his right lower leg and was evaluated in the emergency department on 10/05/2022 and got some Steri-Strip repair to the laceration to his left shin.  He comes back because there was some oozing blood around the site as he cleaned the wound today.  He is worried that is infected because it smells.  He has no systemic signs of infection like fever or chills.   External Medical Documents Reviewed: Emergency department visit dated 10/05/2022 for laceration repair      Physical Exam   Triage Vital Signs: ED Triage Vitals  Enc Vitals Group     BP 10/13/22 0031 (!) 171/106     Pulse Rate 10/13/22 0031 99     Resp 10/13/22 0031 18     Temp 10/13/22 0031 98.3 F (36.8 C)     Temp Source 10/13/22 0031 Oral     SpO2 10/13/22 0031 96 %     Weight 10/13/22 0028 135 lb (61.2 kg)     Height 10/13/22 0028 5' 8"$  (1.727 m)     Head Circumference --      Peak Flow --      Pain Score 10/13/22 0028 0     Pain Loc --      Pain Edu? --      Excl. in [place]? --     Most recent vital signs: Vitals:   10/13/22 0031  BP: (!) 171/106  Pulse: 99  Resp: 18  Temp: 98.3 F (36.8 C)  SpO2: 96%    General: Awake, no distress.  CV:  Good peripheral perfusion.  Resp:  Normal effort.  Abd:  No distention.  Other:  He has a hemostatic wound to the right lower extremity with Steri-Strips intact.  There are no obvious signs of infection like erythema or warmth crepitus or pain out of proportion   ED Results / Procedures / Treatments   Labs (all labs ordered are listed, but only abnormal results are displayed) Labs Reviewed - No data to  display    PROCEDURES:  Critical Care performed: No  Procedures   MEDICATIONS ORDERED IN ED: Medications  cephALEXin (KEFLEX) capsule 500 mg (500 mg Oral Given 10/13/22 0348)   IMPRESSION / MDM / [place] / ED COURSE  I reviewed the triage vital signs and the nursing notes.                                Patient's presentation is most consistent with acute presentation with potential threat to life or bodily function.  Differential diagnosis includes, but is not limited to, wound dehiscence, infection, abscess   The patient is on the cardiac monitor to evaluate for evidence of arrhythmia and/or significant heart rate changes.  MDM: Wound appears to be healing well with some oozing blood previously that is now hemostatic.  I placed a hemostatic dressing on the oozing area previously, and then dressed it with Xeroform and dressing.  I informed the patient to see his primary doctor, or to the emergency department in  another 3-5 days for removal of Steri-Strips if they still have not come off on their own.  Given some mild increase in pain and concern for infection from the patient I gave him several day course of Keflex for infection prophylaxis.        FINAL CLINICAL IMPRESSION(S) / ED DIAGNOSES   Final diagnoses:  Visit for wound check     Rx / DC Orders   ED Discharge Orders          Ordered    cephALEXin (KEFLEX) 500 MG capsule  4 times daily        10/13/22 [id number]             Note:  This document was prepared using Dragon voice recognition software and may include unintentional dictation errors.    Lucillie Garfinkel, MD 10/13/22 [phone]

## 2022-10-13 NOTE — ED Notes (Signed)
Pt Dc to home. Dc instructions reviewed with all questions answered. Pt voices understanding. Pt ambulatory out of dept with steady gait
